# Patient Record
Sex: Male | Born: 1955 | Race: White | Hispanic: No | Marital: Married | Smoking: Current every day smoker
Health system: Southern US, Community
[De-identification: ages and names within clinical notes are randomized; demographics above are authoritative.]

## PROBLEM LIST (undated history)

## (undated) DIAGNOSIS — A770 Spotted fever due to Rickettsia rickettsii: Secondary | ICD-10-CM

## (undated) DIAGNOSIS — Z720 Tobacco use: Secondary | ICD-10-CM

## (undated) DIAGNOSIS — R079 Chest pain, unspecified: Secondary | ICD-10-CM

## (undated) DIAGNOSIS — K5792 Diverticulitis of intestine, part unspecified, without perforation or abscess without bleeding: Secondary | ICD-10-CM

## (undated) HISTORY — PX: FINGER SURGERY: SHX640

## (undated) HISTORY — PX: OTHER SURGICAL HISTORY: SHX169

---

## 2010-01-20 ENCOUNTER — Ambulatory Visit: Payer: Self-pay | Admitting: Cardiology

## 2015-01-20 ENCOUNTER — Observation Stay (HOSPITAL_COMMUNITY)
Admission: AD | Admit: 2015-01-20 | Discharge: 2015-01-21 | Disposition: A | Discharge status: Home / Self Care | Payer: BC Managed Care – PPO | Source: Other Acute Inpatient Hospital | Attending: Cardiology | Admitting: Cardiology

## 2015-01-20 ENCOUNTER — Encounter (HOSPITAL_COMMUNITY): Payer: Self-pay | Admitting: Nurse Practitioner

## 2015-01-20 DIAGNOSIS — Z720 Tobacco use: Secondary | ICD-10-CM | POA: Diagnosis not present

## 2015-01-20 DIAGNOSIS — Z8619 Personal history of other infectious and parasitic diseases: Secondary | ICD-10-CM | POA: Insufficient documentation

## 2015-01-20 DIAGNOSIS — R072 Precordial pain: Secondary | ICD-10-CM

## 2015-01-20 DIAGNOSIS — R0781 Pleurodynia: Secondary | ICD-10-CM | POA: Diagnosis present

## 2015-01-20 DIAGNOSIS — R0789 Other chest pain: Secondary | ICD-10-CM | POA: Diagnosis present

## 2015-01-20 DIAGNOSIS — R079 Chest pain, unspecified: Secondary | ICD-10-CM

## 2015-01-20 HISTORY — DX: Chest pain, unspecified: R07.9

## 2015-01-20 HISTORY — DX: Diverticulitis of intestine, part unspecified, without perforation or abscess without bleeding: K57.92

## 2015-01-20 HISTORY — DX: Tobacco use: Z72.0

## 2015-01-20 HISTORY — DX: Spotted fever due to Rickettsia rickettsii: A77.0

## 2015-01-20 LAB — MRSA PCR SCREENING: MRSA by PCR: NEGATIVE

## 2015-01-20 LAB — HEPARIN LEVEL (UNFRACTIONATED): Heparin Unfractionated: 0.29 IU/mL — ABNORMAL LOW (ref 0.30–0.70)

## 2015-01-20 LAB — D-DIMER, QUANTITATIVE: D-Dimer, Quant: 0.41 ug/mL-FEU (ref 0.00–0.48)

## 2015-01-20 LAB — TROPONIN I

## 2015-01-20 MED ORDER — HYDROCODONE-ACETAMINOPHEN 5-325 MG PO TABS
1.0000 | ORAL_TABLET | Freq: Four times a day (QID) | ORAL | Status: DC | PRN
  Administered 2015-01-20: 2 via ORAL
  Filled 2015-01-20: qty 2

## 2015-01-20 MED ORDER — ONDANSETRON HCL 4 MG/2ML IJ SOLN: 4.0000 mg | Freq: Four times a day (QID) | INTRAMUSCULAR | Status: DC | PRN

## 2015-01-20 MED ORDER — ACETAMINOPHEN 325 MG PO TABS: 650.0000 mg | ORAL_TABLET | ORAL | Status: DC | PRN

## 2015-01-20 MED ORDER — NITROGLYCERIN 0.4 MG SL SUBL: 0.4000 mg | SUBLINGUAL_TABLET | SUBLINGUAL | Status: DC | PRN

## 2015-01-20 MED ORDER — OXYCODONE HCL 5 MG PO TABS
10.0000 mg | ORAL_TABLET | Freq: Once | ORAL | Status: AC
  Administered 2015-01-21: 10 mg via ORAL
  Filled 2015-01-20 (×2): qty 2

## 2015-01-20 MED ORDER — SODIUM CHLORIDE 0.9 % IV SOLN: 250.0000 mL | INTRAVENOUS | Status: DC | PRN

## 2015-01-20 MED ORDER — COLCHICINE 0.6 MG PO TABS
0.6000 mg | ORAL_TABLET | Freq: Two times a day (BID) | ORAL | Status: DC
  Administered 2015-01-20 – 2015-01-21 (×2): 0.6 mg via ORAL
  Filled 2015-01-20 (×2): qty 1

## 2015-01-20 MED ORDER — IBUPROFEN 400 MG PO TABS
400.0000 mg | ORAL_TABLET | Freq: Three times a day (TID) | ORAL | Status: DC
  Administered 2015-01-20 – 2015-01-21 (×2): 400 mg via ORAL
  Filled 2015-01-20 (×2): qty 2
  Filled 2015-01-20 (×4): qty 1

## 2015-01-20 MED ORDER — SODIUM CHLORIDE 0.9 % IJ SOLN: 3.0000 mL | INTRAMUSCULAR | Status: DC | PRN

## 2015-01-20 MED ORDER — HEPARIN (PORCINE) IN NACL 100-0.45 UNIT/ML-% IJ SOLN
1450.0000 [IU]/h | INTRAMUSCULAR | Status: DC
  Administered 2015-01-21: 1450 [IU]/h via INTRAVENOUS
  Filled 2015-01-20: qty 250

## 2015-01-20 MED ORDER — SODIUM CHLORIDE 0.9 % IJ SOLN
3.0000 mL | Freq: Two times a day (BID) | INTRAMUSCULAR | Status: DC
Start: 2015-01-20 — End: 2015-01-21
  Administered 2015-01-20 – 2015-01-21 (×2): 3 mL via INTRAVENOUS

## 2015-01-20 MED ORDER — ASPIRIN EC 81 MG PO TBEC
81.0000 mg | DELAYED_RELEASE_TABLET | Freq: Every day | ORAL | Status: DC
  Administered 2015-01-21: 81 mg via ORAL
  Filled 2015-01-20: qty 1

## 2015-01-20 MED ORDER — PANTOPRAZOLE SODIUM 40 MG PO TBEC
40.0000 mg | DELAYED_RELEASE_TABLET | Freq: Every day | ORAL | Status: DC
  Administered 2015-01-21: 40 mg via ORAL
  Filled 2015-01-20 (×2): qty 1

## 2015-01-20 NOTE — Progress Notes (Signed)
Patient arrived @ [phone]; no acute changes ; chest pain when breath in; vitals WNL; heparin infusion @ 56ml/hr; Nitro drip infusion @ 109mcg/min; Nurse practitioner  In room with patient @ 1850; waiting for admission orders; will give report to on coming RN

## 2015-01-20 NOTE — H&P (Signed)
Patient ID: Steven Craig MRN: [id number], DOB/AGE: 1955/11/14   Admit date: 01/20/2015   Primary Physician: Curlene Labrum, MD Primary Cardiologist: new - would f/u in [place].  Pt. Profile:  59 year old male without prior cardiac history who presents on transfer from [facility] secondary to pleuritic chest pain.  Problem List  Past Medical History  Diagnosis Date  . [facility] spotted fever     a. approx 2011;  b. possible recurrence 11/2014.  . Tobacco abuse     a. 40 pack years, currently smoking 1 ppd.    Past Surgical History  Procedure Laterality Date  . Right ankle fracture and surgical correction       Allergies  Allergies not on file  HPI  59 year old male with a prior history of tobacco abuse. He does not have any prior history of coronary artery disease. He does have some CAD in his father and maternal grandfather but both were elderly when they felt disease. 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 He is relatively active at home but does not routinely exercise. He continues to smoke one pack of cigarettes daily and drinks 3-4 beers daily.  He was in his usual state of health until this afternoon when while driving, he had sudden onset of sharp retrosternal chest discomfort that was worse with deep breathing. He went home and took an aspirin and laid down but symptoms seemed to worsen. He eventually presented to [facility] where ECG was nonacute and troponin was normal. Labs were otherwise unrevealing and chest x-ray was normal. He was treated with heparin, sublingual nitroglycerin, Nitrostat paste, and subsequently IV nitroglycerin and has continued to have intermittent pleuritic chest discomfort that is specifically worsened by lying active and deep breathing. There is no reproduction of symptoms with palpation or position changes. Of note,  on further questioning, he reports that in June, he suffered several tick bites and developed myalgias. He was treated with a 2 week course of doxycycline at that time. His had no recent viral type illnesses or symptoms. He denies any history of PND, orthopnea, dizziness, syncope, edema, or early satiety.  Home Medications  Prior to Admission medications   None     Family History  Family History  Problem Relation Age of Onset  . CAD Father     alive in his 2's  . Seizures      alive in her 65's  . Other Sister     alive and well.    Social History  History   Social History  . Marital Status: Married    Spouse Name: N/A  . Number of Children: N/A  . Years of Education: N/A   Occupational History  . Not on file.   Social History Main Topics  . Smoking status: Current Every Day Smoker -- 1.00 packs/day for 40 years    Types: Cigarettes  . Smokeless tobacco: Not on file  . Alcohol Use: 0.0 oz/week    0 Standard drinks or equivalent per week     Comment: 3-4 beers nightly  . Drug Use: No  . Sexual Activity: Not on file   Other Topics Concern  . Not on file   Social History Narrative   Lives in [place] with wife and 71 y/o daughter.  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  Active but does  not routinely exercise.     Review of Systems General:  No chills, fever, night sweats or weight changes.  Cardiovascular:  Positive pleuritic chest pain as outlined above. No dyspnea on exertion, edema, orthopnea, palpitations, paroxysmal nocturnal dyspnea. Dermatological: No rash, lesions/masses Respiratory: No cough, dyspnea Urologic: No hematuria, dysuria Abdominal:   No nausea, vomiting, diarrhea, bright red blood per rectum, melena, or hematemesis Neurologic:  No visual changes, wkns, changes in mental status. All other systems reviewed and are otherwise negative except as noted above.  Physical Exam  Blood pressure 125/85, pulse 73, temperature  98.8 F (37.1 C), temperature source Oral, resp. rate 13, height 6' 1"  (1.854 m), weight 203 lb 12.8 oz (92.443 kg), SpO2 98 %.  General: Pleasant, NAD Psych: Normal affect. Neuro: Alert and oriented X 3. Moves all extremities spontaneously. HEENT: Normal  Neck: Supple without bruits or JVD. Lungs:  Resp regular and unlabored, CTA. Heart: RRR no s3, s4, or murmurs. No rub. Abdomen: Soft, non-tender, non-distended, BS + x 4.  Extremities: No clubbing, cyanosis or edema. DP/PT/Radials 2+ and equal bilaterally.  Labs  Hemoglobin 15.8, hematocrit 45.7, W BC 8.3, platelets 210  ESR 2  Sodium 137, potassium 3.9, chloride 101, CO2 21, BUN 15, creatinine 1.0, magnesium 2.1, glucose 106  Troponin T less than 0.01  INR 0.9, PTT 30.4, PT 9.9   Radiology/Studies  Chest x-ray at [facility] showed no active cardio pulmonary disease.  ECG  Regular sinus rhythm, 85, no acute ST or T changes.  ASSESSMENT AND PLAN  1. Midsternal chest pain: Patient presented with midsternal and pleuritic chest discomfort that is worse with deep breathing and lying back, beginning this afternoon. He continues to have a low level, dull ache while at rest, that worsens becomes more sharp with lying back or deep breathing. Initial troponin is normal and ECG is nonacute. His symptoms are most consistent with those of pericarditis, though he does not have any evidence of ST segment elevation on his ECG and does not have a rub on exam. He has not had any recent viral illnesses but was treated for presumed [facility] spotted fever/tick borne illness in June of this year. We will admit and cycle cardiac markers. Check d-dimer. Discontinue IV nitroglycerin. Continue heparin for now but discontinue if subsequent troponins are normal. If he rules out for myocardial infarction, we will plan to obtain 2-D echocardiogram in the a.m. to evaluate LV function and rule out pericardial effusion. We will add ibuprofen and  colchicine therapy for presumptive pericarditis. Given risk factors of tobacco abuse and age, we could consider outpatient exercise stress testing. If cardiac markers returned abnormal, we would likely plan to pursue diagnostic catheterization.  2. Tobacco abuse: Complete cessation advised.  3. EtOH abuse: Patient takes 3-4 beers daily. Cessation advised.  Signed, Murray Hodgkins, NP 01/20/2015, 7:10 PM History reviewed with patient. Sudden onset of very sharp chest pain with inspiration, worse with lying down, better with sitting up or standing. Exam tonight reveals patient in no distress lying at 30 degree gatch. Chest reveals scattered rhonchi and he is in the process of trying to quit smoking. Heart reveals no murmur, gallop or rub. Abdomen nontender. Extremities reveal good pulses, no edema.  Symptoms are suggestive of pericarditis.  Agree with plan as noted above. Consider outpatient stress testing if he rules out for myocardial damage. Will get followup EKG in am.

## 2015-01-20 NOTE — Progress Notes (Addendum)
ANTICOAGULATION CONSULT NOTE - Initial Consult  Pharmacy Consult for heparin Indication: chest pain/ACS  Allergies no known allergies  Patient Measurements: Height:  (185.4 cm) Weight: 203 lb 12.8 oz (92.443 kg) IBW/kg (Calculated) : 79.9 Heparin Dosing Weight: 92kg  Vital Signs: Temp: 98.8 F (37.1 C) (07/27 1832) Temp Source: Oral (07/27 1832) BP: 125/85 mmHg (07/27 1840) Pulse Rate: 73 (07/27 1840)  Labs: No results for input(s): HGB, HCT, PLT, APTT, LABPROT, INR, HEPARINUNFRC, CREATININE, CKTOTAL, CKMB, TROPONINI in the last 72 hours.  CrCl cannot be calculated (Patient has no serum creatinine result on file.).   Medical History: Past Medical History  Diagnosis Date  . [facility] spotted fever     a. approx 2011;  b. possible recurrence 11/2014.  . Tobacco abuse     a. 40 pack years, currently smoking 1 ppd.    Medications:  On no scheduled meds PTA  Assessment: 59 year old man admitted to [facility] for ACS.  Heparin infusion infusing from [facility] hospital at 1360 units/hr.  This was started aroun 1530 per records.  Will Check a heparin level at 2130 and adjust per goal. Goal of Therapy:  Heparin level 0.3-0.7 units/ml Monitor platelets by anticoagulation protocol: Yes   Plan:  Continue heparin at 1360 units/hr Heparin level at 2130 - 6 hours after heparin started at [facility] Daily heparin level and CBC  Mickeal Skinner 01/20/2015,8:05 PM  ADDENDUM Initial heparin level slightly low at 0.29 units/mL. No bleeding noted.  Plan: -increase heparin gtt to 1450 units/hr -daily HL and CBC  Fathima Bartl D. Clennon Nasca, PharmD, BCPS Clinical Pharmacist Pager: [phone] 01/20/2015 10:43 PM

## 2015-01-21 ENCOUNTER — Encounter (HOSPITAL_COMMUNITY): Payer: Self-pay | Admitting: General Practice

## 2015-01-21 ENCOUNTER — Inpatient Hospital Stay (HOSPITAL_BASED_OUTPATIENT_CLINIC_OR_DEPARTMENT_OTHER): Payer: BC Managed Care – PPO

## 2015-01-21 ENCOUNTER — Other Ambulatory Visit: Payer: Self-pay | Admitting: Physician Assistant

## 2015-01-21 DIAGNOSIS — Z8619 Personal history of other infectious and parasitic diseases: Secondary | ICD-10-CM | POA: Diagnosis not present

## 2015-01-21 DIAGNOSIS — R0781 Pleurodynia: Secondary | ICD-10-CM | POA: Diagnosis not present

## 2015-01-21 DIAGNOSIS — R079 Chest pain, unspecified: Secondary | ICD-10-CM | POA: Diagnosis not present

## 2015-01-21 DIAGNOSIS — Z720 Tobacco use: Secondary | ICD-10-CM | POA: Diagnosis not present

## 2015-01-21 DIAGNOSIS — R072 Precordial pain: Secondary | ICD-10-CM | POA: Diagnosis not present

## 2015-01-21 DIAGNOSIS — R0789 Other chest pain: Secondary | ICD-10-CM

## 2015-01-21 LAB — LIPID PANEL
Cholesterol: 222 mg/dL — ABNORMAL HIGH (ref 0–200)
HDL: 37 mg/dL — ABNORMAL LOW (ref >40)
LDL CALC: 153 mg/dL — AB (ref 0–99)
Total CHOL/HDL Ratio: 6 RATIO
Triglycerides: 162 mg/dL — ABNORMAL HIGH (ref <150)
VLDL: 32 mg/dL (ref 0–40)

## 2015-01-21 LAB — TSH: TSH: 2.935 u[IU]/mL (ref 0.350–4.500)

## 2015-01-21 LAB — CBC
HEMATOCRIT: 42.9 % (ref 39.0–52.0)
Hemoglobin: 14.6 g/dL (ref 13.0–17.0)
MCH: 31.8 pg (ref 26.0–34.0)
MCHC: 34 g/dL (ref 30.0–36.0)
MCV: 93.5 fL (ref 78.0–100.0)
Platelets: 178 10*3/uL (ref 150–400)
RBC: 4.59 MIL/uL (ref 4.22–5.81)
RDW: 14.2 % (ref 11.5–15.5)
WBC: 10.3 10*3/uL (ref 4.0–10.5)

## 2015-01-21 LAB — BASIC METABOLIC PANEL
ANION GAP: 4 — AB (ref 5–15)
BUN: 12 mg/dL (ref 6–20)
CO2: 27 mmol/L (ref 22–32)
Calcium: 8.5 mg/dL — ABNORMAL LOW (ref 8.9–10.3)
Chloride: 107 mmol/L (ref 101–111)
Creatinine, Ser: 0.97 mg/dL (ref 0.61–1.24)
GFR calc non Af Amer: >60 mL/min (ref >60)
Glucose, Bld: 99 mg/dL (ref 65–99)
Potassium: 4 mmol/L (ref 3.5–5.1)
Sodium: 138 mmol/L (ref 135–145)

## 2015-01-21 LAB — TROPONIN I
Troponin I: <0.03 ng/mL (ref <0.031)
Troponin I: <0.03 ng/mL (ref <0.031)

## 2015-01-21 LAB — HEPARIN LEVEL (UNFRACTIONATED)
Heparin Unfractionated: 0.35 IU/mL (ref 0.30–0.70)
Heparin Unfractionated: 0.36 IU/mL (ref 0.30–0.70)

## 2015-01-21 LAB — SEDIMENTATION RATE: Sed Rate: 7 mm/hr (ref 0–16)

## 2015-01-21 MED ORDER — ASPIRIN 81 MG PO TBEC: 81.0000 mg | DELAYED_RELEASE_TABLET | Freq: Every day | ORAL | Status: AC

## 2015-01-21 MED ORDER — IBUPROFEN 200 MG PO TABS: 400.0000 mg | ORAL_TABLET | Freq: Three times a day (TID) | ORAL | Status: AC

## 2015-01-21 MED FILL — Nitroglycerin IV Soln 200 MCG/ML in D5W: INTRAVENOUS | Qty: 250 | Status: AC

## 2015-01-21 MED FILL — Heparin Sodium (Porcine) 100 Unt/ML in Sodium Chloride 0.45%: INTRAMUSCULAR | Qty: 250 | Status: AC

## 2015-01-21 NOTE — Progress Notes (Signed)
UR Completed Clay Solum Graves-Bigelow, RN,BSN [phone]  

## 2015-01-21 NOTE — Progress Notes (Signed)
    Subjective:  Feels better today. Still with mild sharp discomfort on inspiration. No shortness of breath.  Objective:  Vital Signs in the last 24 hours: Temp:  [97.8 F (36.6 C)-98.8 F (37.1 C)] 97.8 F (36.6 C) (07/28 0520) Pulse Rate:  [59-86] 59 (07/28 0520) Resp:  [13-18] 16 (07/28 0242) BP: (108-125)/(67-87) 113/80 mmHg (07/28 0520) SpO2:  [94 %-98 %] 96 % (07/28 0520) Weight:  [203 lb 12.8 oz (92.443 kg)] 203 lb 12.8 oz (92.443 kg) (07/27 1832)  Intake/Output from previous day: 07/27 0701 - 07/28 0700 In: 240 [P.O.:240] Out: 725 [Urine:725]  Physical Exam: Pt is alert and oriented, NAD HEENT: normal Neck: JVP - normal Lungs: CTA bilaterally CV: RRR without murmur or gallop. No friction rub Abd: soft, NT Ext: no C/C/E, distal pulses intact and equal Skin: warm/dry no rash   Lab Results:  Recent Labs  01/21/15 0401  WBC 10.3  HGB 14.6  PLT 178    Recent Labs  01/21/15 0719  NA 138  K 4.0  CL 107  CO2 27  GLUCOSE 99  BUN 12  CREATININE 0.97    Recent Labs  01/21/15 0108 01/21/15 0719  TROPONINI <0.03 <0.03    Cardiac Studies: 2-D echocardiogram pending  Tele: Normal sinus rhythm  Assessment/Plan:  1. Pleuritic chest pain: No EKG evidence of pericarditis. He does feel better with non-steroidal anti-inflammatory. Differential diagnosis includes acute carditis without EKG changes versus costochondritis. 2-D echo pending today. I think he can be discharged home after his echocardiogram. Would discharge on 1-2 weeks of non-small and tight inflammatory medication as needed. Recommend outpatient exercise treadmill study to evaluate for ischemic heart disease.  2. Tobacco abuse: Cessation counseling done.  Disposition: Home today as long as echocardiogram shows no major abnormalities.   Tonny Bollman, M.D. 01/21/2015, 12:58 PM

## 2015-01-21 NOTE — Progress Notes (Signed)
ANTICOAGULATION CONSULT NOTE - Initial Consult  Pharmacy Consult for heparin Indication: chest pain/ACS  No Known Allergies  Patient Measurements: Height:  (185.4 cm) Weight: 203 lb 12.8 oz (92.443 kg) IBW/kg (Calculated) : 79.9 Heparin Dosing Weight: 92kg  Vital Signs: Temp: 97.8 F (36.6 C) (07/28 0520) Temp Source: Oral (07/28 0520) BP: 113/80 mmHg (07/28 0520) Pulse Rate: 59 (07/28 0520)  Labs:  Recent Labs  01/20/15 2154 01/21/15 0108 01/21/15 0401 01/21/15 0719  HGB  --   --  14.6  --   HCT  --   --  42.9  --   PLT  --   --  178  --   HEPARINUNFRC 0.29*  --  0.35  --   CREATININE  --   --   --  0.97  TROPONINI <0.03 <0.03  --  <0.03    Estimated Creatinine Clearance: 96.1 mL/min (by C-G formula based on Cr of 0.97).   Medical History: Past Medical History  Diagnosis Date  . [facility] spotted fever     a. approx 2011;  b. possible recurrence 11/2014.  . Tobacco abuse     a. 40 pack years, currently smoking 1 ppd.  . Chest pain 01/20/2015  . Diverticulitis     Medications:  On no scheduled meds PTA  Assessment: 59 year old man admitted with chest pain, initiated heparin gtt last night for possible ACS. Troponins negative. CBC stable and wnl, SCr 0.9, no s/sx bleeding.   Goal of Therapy:  Heparin level 0.3-0.7 units/ml Monitor platelets by anticoagulation protocol: Yes   Plan:  Continue heparin at 1450 units/hr Daily HL, CBC Monitor s/sx bleeding  F/u cardiology plans    Agapito Games, PharmD, BCPS Clinical Pharmacist Pager: [phone] 01/21/2015 8:56 AM

## 2015-01-21 NOTE — Progress Notes (Addendum)
@  7829 Assumed care from off going RN; patient resting in bed with no complaints at this time; heparin drip infusion @ 14.100ml/hr; patient NPO; will continue to monitor patient;    patient has c/o's pain 3 on pain scale; medication offer but refused   heparin drip stopped

## 2015-01-21 NOTE — Discharge Instructions (Addendum)
Recommend taking ibuprofen 400mg  three times daily for a week.  Cardiac Diet This diet can help prevent heart disease and stroke. Many factors influence your heart health, including eating and exercise habits. Coronary risk rises a lot with abnormal blood fat (lipid) levels. Cardiac meal planning includes limiting unhealthy fats, increasing healthy fats, and making other small dietary changes. General guidelines are as follows:  Adjust calorie intake to reach and maintain desirable body weight.  Limit total fat intake to less than 30% of total calories. Saturated fat should be less than 7% of calories.  Saturated fats are found in animal products and in some vegetable products. Saturated vegetable fats are found in coconut oil, cocoa butter, palm oil, and palm kernel oil. Read labels carefully to avoid these products as much as possible. Use butter in moderation. Choose tub margarines and oils that have 2 grams of fat or less. Good cooking oils are canola and olive oils.  Practice low-fat cooking techniques. Do not fry food. Instead, broil, bake, boil, steam, grill, roast on a rack, stir-fry, or microwave it. Other fat reducing suggestions include:  Remove the skin from poultry.  Remove all visible fat from meats.  Skim the fat off stews, soups, and gravies before serving them.  Steam vegetables in water or broth instead of sauting them in fat.  Avoid foods with trans fat (or hydrogenated oils), such as commercially fried foods and commercially baked goods. Commercial shortening and deep-frying fats will contain trans fat.  Increase intake of fruits, vegetables, whole grains, and legumes to replace foods high in fat.  Increase consumption of nuts, legumes, and seeds to at least 4 servings weekly. One serving of a legume equals  cup, and 1 serving of nuts or seeds equals  cup.  Choose whole grains more often. Have 3 servings per day (a serving is 1 ounce [oz]).  Eat 4 to 5 servings of  vegetables per day. A serving of vegetables is 1 cup of raw leafy vegetables;  cup of raw or cooked cut-up vegetables;  cup of vegetable juice.  Eat 4 to 5 servings of fruit per day. A serving of fruit is 1 medium whole fruit;  cup of dried fruit;  cup of fresh, frozen, or canned fruit;  cup of 100% fruit juice.  Increase your intake of dietary fiber to 20 to 30 grams per day. Insoluble fiber may help lower your risk of heart disease and may help curb your appetite.  Soluble fiber binds cholesterol to be removed from the blood. Foods high in soluble fiber are dried beans, citrus fruits, oats, apples, bananas, broccoli, Brussels sprouts, and eggplant.  Try to include foods fortified with plant sterols or stanols, such as yogurt, breads, juices, or margarines. Choose several fortified foods to achieve a daily intake of 2 to 3 grams of plant sterols or stanols.  Foods with omega-3 fats can help reduce your risk of heart disease. Aim to have a 3.5 oz portion of fatty fish twice per week, such as salmon, mackerel, albacore tuna, sardines, lake trout, or herring. If you wish to take a fish oil supplement, choose one that contains 1 gram of both DHA and EPA.  Limit processed meats to 2 servings (3 oz portion) weekly.  Limit the sodium in your diet to 1500 milligrams (mg) per day. If you have high blood pressure, talk to a registered dietitian about a DASH (Dietary Approaches to Stop Hypertension) eating plan.  Limit sweets and beverages with added sugar, such  as soda, to no more than 5 servings per week. One serving is:   1 tablespoon sugar.  1 tablespoon jelly or jam.   cup sorbet.  1 cup lemonade.   cup regular soda. CHOOSING FOODS Starches  Allowed: Breads: All kinds (wheat, rye, raisin, white, oatmeal, [place], [place], and English muffin bread). Low-fat rolls: English muffins, frankfurter and hamburger buns, bagels, pita bread, tortillas (not fried). Pancakes, waffles, biscuits, and  muffins made with recommended oil.  Avoid: Products made with saturated or trans fats, oils, or whole milk products. Butter rolls, cheese breads, croissants. Commercial doughnuts, muffins, sweet rolls, biscuits, waffles, pancakes, store-bought mixes. Crackers  Allowed: Low-fat crackers and snacks: Animal, graham, rye, saltine (with recommended oil, no lard), oyster, and matzo crackers. Bread sticks, melba toast, rusks, flatbread, pretzels, and light popcorn.  Avoid: High-fat crackers: cheese crackers, butter crackers, and those made with coconut, palm oil, or trans fat (hydrogenated oils). Buttered popcorn. Cereals  Allowed: Hot or cold whole-grain cereals.  Avoid: Cereals containing coconut, hydrogenated vegetable fat, or animal fat. Potatoes / Pasta / Rice  Allowed: All kinds of potatoes, rice, and pasta (such as macaroni, spaghetti, and noodles).  Avoid: Pasta or rice prepared with cream sauce or high-fat cheese. Chow mein noodles, [place] fries. Vegetables  Allowed: All vegetables and vegetable juices.  Avoid: Fried vegetables. Vegetables in cream, butter, or high-fat cheese sauces. Limit coconut. Fruit in cream or custard. Protein  Allowed: Limit your intake of meat, seafood, and poultry to no more than 6 oz (cooked weight) per day. All lean, well-trimmed beef, veal, pork, and lamb. All chicken and [place] without skin. All fish and shellfish. Wild game: wild duck, rabbit, pheasant, and venison. Egg whites or low-cholesterol egg substitutes may be used as desired. Meatless dishes: recipes with dried beans, peas, lentils, and tofu (soybean curd). Seeds and nuts: all seeds and most nuts.  Avoid: Prime grade and other heavily marbled and fatty meats, such as short ribs, spare ribs, rib eye roast or steak, frankfurters, sausage, bacon, and high-fat luncheon meats, mutton. Caviar. Commercially fried fish. Domestic duck, goose, venison sausage. Organ meats: liver, gizzard, heart,  chitterlings, brains, kidney, sweetbreads. Dairy  Allowed: Low-fat cheeses: nonfat or low-fat cottage cheese (1% or 2% fat), cheeses made with part skim milk, such as mozzarella, farmers, string, or ricotta. (Cheeses should be labeled no more than 2 to 6 grams fat per oz.). Skim (or 1%) milk: liquid, powdered, or evaporated. Buttermilk made with low-fat milk. Drinks made with skim or low-fat milk or cocoa. Chocolate milk or cocoa made with skim or low-fat (1%) milk. Nonfat or low-fat yogurt.  Avoid: Whole milk cheeses, including colby, cheddar, [place], [address], [place], [place], [place], [address], [place], and blue. Creamed cottage cheese, cream cheese. Whole milk and whole milk products, including buttermilk or yogurt made from whole milk, drinks made from whole milk. Condensed milk, evaporated whole milk, and 2% milk. Soups and Combination Foods  Allowed: Low-fat low-sodium soups: broth, dehydrated soups, homemade broth, soups with the fat removed, homemade cream soups made with skim or low-fat milk. Low-fat spaghetti, lasagna, chili, and Spanish rice if low-fat ingredients and low-fat cooking techniques are used.  Avoid: Cream soups made with whole milk, cream, or high-fat cheese. All other soups. Desserts and Sweets  Allowed: Sherbet, fruit ices, gelatins, meringues, and angel food cake. Homemade desserts with recommended fats, oils, and milk products. Jam, jelly, honey, marmalade, sugars, and syrups. Pure sugar candy, such as gum drops, hard candy, jelly beans, marshmallows,  mints, and small amounts of dark chocolate.  Avoid: Commercially prepared cakes, pies, cookies, frosting, pudding, or mixes for these products. Desserts containing whole milk products, chocolate, coconut, lard, palm oil, or palm kernel oil. Ice cream or ice cream drinks. Candy that contains chocolate, coconut, butter, hydrogenated fat, or unknown ingredients. Buttered syrups. Fats and Oils  Allowed: Vegetable oils:  safflower, sunflower, corn, soybean, cottonseed, sesame, canola, olive, or peanut. Non-hydrogenated margarines. Salad dressing or mayonnaise: homemade or commercial, made with a recommended oil. Low or nonfat salad dressing or mayonnaise.  Limit added fats and oils to 6 to 8 tsp per day (includes fats used in cooking, baking, salads, and spreads on bread). Remember to count the "hidden fats" in foods.  Avoid: Solid fats and shortenings: butter, lard, salt pork, bacon drippings. Gravy containing meat fat, shortening, or suet. Cocoa butter, coconut. Coconut oil, palm oil, palm kernel oil, or hydrogenated oils: these ingredients are often used in bakery products, nondairy creamers, whipped toppings, candy, and commercially fried foods. Read labels carefully. Salad dressings made of unknown oils, sour cream, or cheese, such as blue cheese and Roquefort. Cream, all kinds: half-and-half, light, heavy, or whipping. Sour cream or cream cheese (even if "light" or low-fat). Nondairy cream substitutes: coffee creamers and sour cream substitutes made with palm, palm kernel, hydrogenated oils, or coconut oil. Beverages  Allowed: Coffee (regular or decaffeinated), tea. Diet carbonated beverages, mineral water. Alcohol: Check with your caregiver. Moderation is recommended.  Avoid: Whole milk, regular sodas, and juice drinks with added sugar. Condiments  Allowed: All seasonings and condiments. Cocoa powder. "Cream" sauces made with recommended ingredients.  Avoid: Carob powder made with hydrogenated fats. SAMPLE MENU Breakfast   cup orange juice   cup oatmeal  1 slice toast  1 tsp margarine  1 cup skim milk Lunch  [place] sandwich with 2 oz [place], 2 slices bread  Lettuce and tomato slices  Fresh fruit  Carrot sticks  Coffee or tea Snack  Fresh fruit or low-fat crackers Dinner  3 oz lean ground beef  1 baked potato  1 tsp margarine   cup asparagus  Lettuce salad  1 tbs  non-creamy dressing   cup peach slices  1 cup skim milk Document Released: 03/21/2008 Document Revised: 12/12/2011 Document Reviewed: 08/12/2013 Exit[employer] Patient Information 2015 [place], [place]. This information is not intended to replace advice given to you by your health care provider. Make sure you discuss any questions you have with your health care provider.

## 2015-01-22 NOTE — Discharge Summary (Signed)
Physician Discharge Summary     Cardiologist: New. He will follow-up in [facility] Patient ID: SNEIJDER BERNARDS MRN: [id number] DOB/AGE: 1956/01/16 59 y.o.  Admit date: 01/20/2015 Discharge date: 01/22/2015  Admission Diagnoses: Midsternal chest pain  Discharge Diagnoses:  Active Problems:   Midsternal chest pain   tobacco abuse   EtOH abuse  Discharged Condition: stable  Hospital Course:  59 year old male with a prior history of tobacco abuse. He does not have any prior history of coronary artery disease. He does have some CAD in his father and maternal grandfather but both were elderly when they felt disease. 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 He was previously a middle school Retail buyer. He is relatively active at home but does not routinely exercise. He continues to smoke one pack of cigarettes daily and drinks 3-4 beers daily.  He was in his usual state of health until this afternoon when while driving, he had sudden onset of sharp retrosternal chest discomfort that was worse with deep breathing. He went home and took an aspirin and laid down but symptoms seemed to worsen. He eventually presented to [facility] where ECG was nonacute and troponin was normal. Labs were otherwise unrevealing and chest x-ray was normal. He was treated with heparin, sublingual nitroglycerin, Nitrostat paste, and subsequently IV nitroglycerin and has continued to have intermittent pleuritic chest discomfort that is specifically worsened by lying active and deep breathing. There is no reproduction of symptoms with palpation or position changes. Of note, on further questioning, he reports that in June, he suffered several tick bites and developed myalgias. He was treated with a 2 week course of doxycycline at that time. His had no recent viral type illnesses or symptoms. He denies any history of PND, orthopnea, dizziness, syncope, edema, or early  satiety.  Patient was admitted and enzymes were negative. We discontinued his IV nitroglycerin did continue heparin initially. He was also started on ibuprofen and colchicine for presumptive pericarditis.  He had a 2-D echocardiogram which revealed an ejection fraction of 55-60%. LV was normal in size. No pericardial effusion. Grade 1 diastolic dysfunction.  We discontinued colchicine prior to discharge but did continue ibuprofen for 1 week.  He will have an outpatient stress test.  The patient was seen by Dr. Excell Seltzer who felt he was stable for DC home.   Consults: None  Significant Diagnostic Studies:  Echocardiogram Study Conclusions  - Left ventricle: The cavity size was normal. Wall thickness was normal. Systolic function was normal. The estimated ejection fraction was in the range of 55% to 60%. Wall motion was normal; there were no regional wall motion abnormalities. Doppler parameters are consistent with abnormal left ventricular relaxation (grade 1 diastolic dysfunction). - Aortic valve: There was no stenosis. - Mitral valve: There was no significant regurgitation. - Left atrium: The atrium was mildly dilated. - Right ventricle: The cavity size was normal. Systolic function was normal. - Right atrium: The atrium was mildly dilated. - Pulmonary arteries: No complete TR doppler jet so unable to estimate PA systolic pressure. - Inferior vena cava: The vessel was normal in size. The respirophasic diameter changes were in the normal range (>= 50%), consistent with normal central venous pressure.  Impressions:  - Normal LV size and systolic function, EF 55-60%. Normal RV size and systolic function. No significant valvular abnormalities.  Treatments: See above  Discharge Exam: Blood pressure 151/82, pulse 86, temperature 98.7 F (  37.1 C), temperature source Oral, resp. rate 16, height  (1.854 m), weight 203 lb 12.8 oz (92.443 kg), SpO2 96  %.   Disposition: 01-Home or Self Care  Discharge Instructions    Diet - low sodium heart healthy    Complete by:  As directed      Increase activity slowly    Complete by:  As directed             Medication List    TAKE these medications        acetaminophen 500 MG tablet  Commonly known as:  TYLENOL  Take 500-1,000 mg by mouth every 6 (six) hours as needed for mild pain or headache.     aspirin 81 MG EC tablet  Take 1 tablet (81 mg total) by mouth daily.     ibuprofen 200 MG tablet  Commonly known as:  ADVIL,MOTRIN  Take 2 tablets (400 mg total) by mouth 3 (three) times daily.  Notes to Patient:  Take one dose tonight     tetrahydrozoline-zinc 0.05-0.25 % ophthalmic solution  Commonly known as:  VISINE-AC  Place 2 drops into both eyes 3 (three) times daily as needed (allergies).           Follow-up Information    Follow up with Juliette Alcide, MD In 1 week.   Why:  If symptoms worsen   Contact information:   [address] [place] [address] [phone]      Greater than 30 minutes was spent completing the patient's discharge.   SignedWilburt Finlay, PA-C 01/22/2015, 3:03 PM

## 2015-01-26 ENCOUNTER — Telehealth (HOSPITAL_COMMUNITY): Payer: Self-pay | Admitting: *Deleted

## 2015-01-26 ENCOUNTER — Telehealth (HOSPITAL_COMMUNITY): Payer: Self-pay

## 2015-01-26 NOTE — Telephone Encounter (Signed)
Left message on voicemail in reference to upcoming appointment scheduled for 01/26/15. Phone number given for a call back so details instructions can be given. Viraaj Vorndran, Adelene Idler

## 2015-01-26 NOTE — Telephone Encounter (Signed)
Patient given detailed instructions per Myocardial Perfusion Study Information Sheet for test on 01-28-2015 at 0930. Patient Notified to arrive 15 minutes early, and that it is imperative to arrive on time for appointment to keep from having the test rescheduled. Patient verbalized understanding. Per Allen Kell, CNMT/ Irean Hong, RN

## 2015-01-28 ENCOUNTER — Ambulatory Visit (HOSPITAL_COMMUNITY): Payer: BC Managed Care – PPO | Attending: Cardiovascular Disease

## 2015-01-28 DIAGNOSIS — R0789 Other chest pain: Secondary | ICD-10-CM | POA: Diagnosis not present

## 2015-01-28 LAB — MYOCARDIAL PERFUSION IMAGING
CHL CUP NUCLEAR SRS: 0
CHL CUP NUCLEAR SSS: 1
CHL CUP RESTING HR STRESS: 61 {beats}/min
CSEPHR: 1 %
CSEPPHR: 169 {beats}/min
Estimated workload: 12.5 METS
Exercise duration (min): 10 min
Exercise duration (sec): 30 s
LHR: 0.33
LV dias vol: 95 mL
LV sys vol: 44 mL
MPHR: 164 {beats}/min
RPE: 15
SDS: 1
TID: 0.87

## 2015-01-28 MED ORDER — TECHNETIUM TC 99M SESTAMIBI GENERIC - CARDIOLITE
30.0000 | Freq: Once | INTRAVENOUS | Status: AC | PRN
  Administered 2015-01-28: 30 via INTRAVENOUS

## 2015-01-28 MED ORDER — TECHNETIUM TC 99M SESTAMIBI GENERIC - CARDIOLITE
10.9000 | Freq: Once | INTRAVENOUS | Status: AC | PRN
  Administered 2015-01-28: 10.9 via INTRAVENOUS

## 2015-12-10 IMAGING — NM NM MISC PROCEDURE
8 series · 48 of 48 positions shown · non-contrast
Comparison: none

[Series 1: rest · 6.40mm/px · 6 of 64 frames shown]
[frame 6/64]
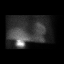
[frame 16/64]
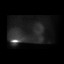
[frame 27/64]
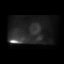
[frame 38/64]
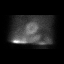
[frame 48/64]
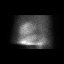
[frame 59/64]
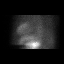

[Series 1: stress-sum-em · 6.40mm/px · 6 of 64 frames shown]
[frame 6/64]
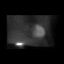
[frame 16/64]
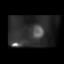
[frame 27/64]
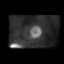
[frame 38/64]
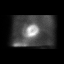
[frame 48/64]
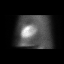
[frame 59/64]
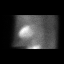

[Series 1: stress-sum-em-mc · 6.40mm/px · 6 of 64 frames shown]
[frame 6/64]
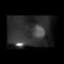
[frame 16/64]
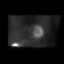
[frame 27/64]
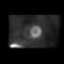
[frame 38/64]
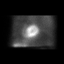
[frame 48/64]
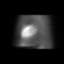
[frame 59/64]
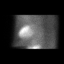

[Series 1: wbr_s-card_st stress-gsp · 6.4mm · 6.40mm/px · 6 of 194 frames shown]
[frame 17/194]
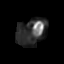
[frame 49/194]
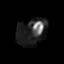
[frame 81/194]
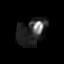
[frame 114/194]
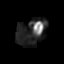
[frame 146/194]
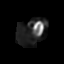
[frame 178/194]
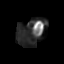

[Series 1: stress-gsp-mc · 6.40mm/px · 6 of 512 frames shown]
[frame 43/512]
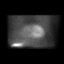
[frame 128/512]
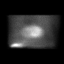
[frame 214/512]
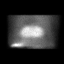
[frame 299/512]
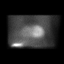
[frame 384/512]
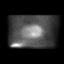
[frame 470/512]
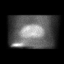

[Series 1: stress-gsp · 6.40mm/px · 6 of 512 frames shown]
[frame 43/512]
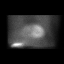
[frame 128/512]
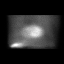
[frame 214/512]
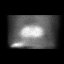
[frame 299/512]
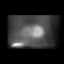
[frame 384/512]
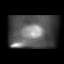
[frame 470/512]
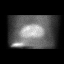

[Series 1: wbr_s-card_st stress-sum-em · 6.4mm · 6.40mm/px · 6 of 24 frames shown]
[frame 3/24]
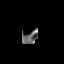
[frame 7/24]
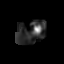
[frame 11/24]
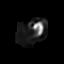
[frame 15/24]
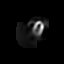
[frame 19/24]
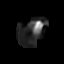
[frame 23/24]
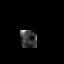

[Series 1: wbr_r-card_st rest · 6.4mm · 6.40mm/px · 6 of 21 frames shown]
[frame 2/21]
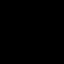
[frame 6/21]
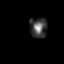
[frame 9/21]
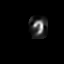
[frame 13/21]
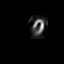
[frame 16/21]
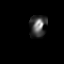
[frame 20/21]
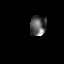

[48 of 48 positions shown; findings below may reference images not displayed]

Canned report from images found in remote index.

Refer to host system for actual result text.

## 2020-04-22 ENCOUNTER — Other Ambulatory Visit (HOSPITAL_COMMUNITY): Payer: Self-pay | Admitting: Family Medicine

## 2020-04-22 DIAGNOSIS — F17219 Nicotine dependence, cigarettes, with unspecified nicotine-induced disorders: Secondary | ICD-10-CM

## 2020-05-18 ENCOUNTER — Encounter (HOSPITAL_COMMUNITY): Payer: Self-pay

## 2020-05-18 ENCOUNTER — Ambulatory Visit (HOSPITAL_COMMUNITY): Payer: BC Managed Care – PPO

## 2021-09-21 ENCOUNTER — Other Ambulatory Visit: Payer: Self-pay

## 2021-09-21 ENCOUNTER — Encounter (HOSPITAL_COMMUNITY): Payer: Self-pay

## 2021-09-21 ENCOUNTER — Emergency Department (HOSPITAL_COMMUNITY)
Admission: EM | Admit: 2021-09-21 | Discharge: 2021-09-21 | Disposition: A | Discharge status: Home / Self Care | Payer: BC Managed Care – PPO | Attending: Student | Admitting: Student

## 2021-09-21 DIAGNOSIS — Z7982 Long term (current) use of aspirin: Secondary | ICD-10-CM | POA: Insufficient documentation

## 2021-09-21 DIAGNOSIS — R112 Nausea with vomiting, unspecified: Secondary | ICD-10-CM | POA: Diagnosis not present

## 2021-09-21 DIAGNOSIS — R531 Weakness: Secondary | ICD-10-CM | POA: Insufficient documentation

## 2021-09-21 DIAGNOSIS — R55 Syncope and collapse: Secondary | ICD-10-CM | POA: Diagnosis not present

## 2021-09-21 DIAGNOSIS — R109 Unspecified abdominal pain: Secondary | ICD-10-CM | POA: Diagnosis not present

## 2021-09-21 LAB — CBC WITH DIFFERENTIAL/PLATELET
Abs Immature Granulocytes: 0.08 10*3/uL — ABNORMAL HIGH (ref 0.00–0.07)
Basophils Absolute: 0.1 10*3/uL (ref 0.0–0.1)
Basophils Relative: 1 %
Eosinophils Absolute: 0.1 10*3/uL (ref 0.0–0.5)
Eosinophils Relative: 1 %
HCT: 41.9 % (ref 39.0–52.0)
Hemoglobin: 14.3 g/dL (ref 13.0–17.0)
Immature Granulocytes: 1 %
Lymphocytes Relative: 8 %
Lymphs Abs: 1.2 10*3/uL (ref 0.7–4.0)
MCH: 31.5 pg (ref 26.0–34.0)
MCHC: 34.1 g/dL (ref 30.0–36.0)
MCV: 92.3 fL (ref 80.0–100.0)
Monocytes Absolute: 1 10*3/uL (ref 0.1–1.0)
Monocytes Relative: 7 %
Neutro Abs: 11.9 10*3/uL — ABNORMAL HIGH (ref 1.7–7.7)
Neutrophils Relative %: 82 %
Platelets: 207 10*3/uL (ref 150–400)
RBC: 4.54 MIL/uL (ref 4.22–5.81)
RDW: 12.9 % (ref 11.5–15.5)
WBC: 14.3 10*3/uL — ABNORMAL HIGH (ref 4.0–10.5)
nRBC: 0 % (ref 0.0–0.2)

## 2021-09-21 LAB — COMPREHENSIVE METABOLIC PANEL
ALT: 20 U/L (ref 0–44)
AST: 22 U/L (ref 15–41)
Albumin: 4.1 g/dL (ref 3.5–5.0)
Alkaline Phosphatase: 62 U/L (ref 38–126)
Anion gap: 13 (ref 5–15)
BUN: 30 mg/dL — ABNORMAL HIGH (ref 8–23)
CO2: 20 mmol/L — ABNORMAL LOW (ref 22–32)
Calcium: 9.1 mg/dL (ref 8.9–10.3)
Chloride: 103 mmol/L (ref 98–111)
Creatinine, Ser: 1.06 mg/dL (ref 0.61–1.24)
GFR, Estimated: >60 mL/min (ref >60)
Glucose, Bld: 160 mg/dL — ABNORMAL HIGH (ref 70–99)
Potassium: 4 mmol/L (ref 3.5–5.1)
Sodium: 136 mmol/L (ref 135–145)
Total Bilirubin: 0.6 mg/dL (ref 0.3–1.2)
Total Protein: 7 g/dL (ref 6.5–8.1)

## 2021-09-21 LAB — D-DIMER, QUANTITATIVE: D-Dimer, Quant: 0.71 ug/mL-FEU — ABNORMAL HIGH (ref 0.00–0.50)

## 2021-09-21 LAB — TROPONIN I (HIGH SENSITIVITY)
Troponin I (High Sensitivity): <2 ng/L (ref <18)
Troponin I (High Sensitivity): 2 ng/L (ref <18)

## 2021-09-21 LAB — LIPASE, BLOOD: Lipase: 37 U/L (ref 11–51)

## 2021-09-21 MED ORDER — ONDANSETRON HCL 4 MG/2ML IJ SOLN
INTRAMUSCULAR | Status: AC
  Administered 2021-09-21: 4 mg via INTRAVENOUS
  Filled 2021-09-21: qty 2

## 2021-09-21 MED ORDER — LACTATED RINGERS IV BOLUS
1000.0000 mL | Freq: Once | INTRAVENOUS | Status: AC
  Administered 2021-09-21: 1000 mL via INTRAVENOUS

## 2021-09-21 MED ORDER — ONDANSETRON HCL 4 MG/2ML IJ SOLN: 4.0000 mg | Freq: Once | INTRAMUSCULAR | Status: AC

## 2021-09-21 NOTE — ED Provider Notes (Signed)
?[facility] ?Provider Note ? ? ?CSN: [id number] ?Arrival date & time: 09/21/21  1815 ? ?  ? ?History ? ?Chief Complaint  ?Patient presents with  ? Weakness  ? ? ?[facility] is a 66 y.o. male.  With past medical history of tobacco use presents to the emergency department with complaint of weakness. ? ?Patient states that yesterday he drove 14 hours back to [place] from [place].  He states this morning he woke up feeling normal.  [place] this morning to return his rental vehicle.  He states that his wife followed him.  They dropped the car off and he got in his wife's car to return home.  He states that while they were returning home he suddenly felt "overheated and began sweating like a pig."  He states that he took off his jacket and went to put it in the backseat when he "blacked out."  He states that he remembers waking up in the car was on the side of the road and his wife had called EMS.  He states that while he was waiting for EMS he felt like his abdomen was cramping and he was having severe nausea.  He had multiple episodes of vomiting after this.  He continues to have abdominal cramping now.  He denies any diarrhea, chest pain, shortness of breath, palpitations, fevers. ? ?The history is provided by the patient.  ?Weakness ?Associated symptoms: abdominal pain, nausea and vomiting   ?Associated symptoms: no chest pain, no diarrhea, no dysuria, no fever and no shortness of breath   ? ?  ? ?Home Medications ?Prior to Admission medications   ?Medication Sig Start Date End Date Taking? Authorizing Provider  ?acetaminophen (TYLENOL) 500 MG tablet Take 500-1,000 mg by mouth every 6 (six) hours as needed for mild pain or headache.    [provider]  ?aspirin EC 81 MG EC tablet Take 1 tablet (81 mg total) by mouth daily. 01/21/15   Dwana MelenaHager, Bryan W, PA-C  ?tetrahydrozoline-zinc (VISINE-AC) 0.05-0.25 % ophthalmic solution Place 2 drops into both eyes 3 (three) times daily  as needed (allergies).    [provider]  ?   ? ?Allergies    ?Patient has no known allergies.   ? ?Review of Systems   ?Review of Systems  ?Constitutional:  Positive for diaphoresis and fatigue. Negative for fever.  ?Respiratory:  Negative for shortness of breath.   ?Cardiovascular:  Negative for chest pain.  ?Gastrointestinal:  Positive for abdominal pain, nausea and vomiting. Negative for abdominal distention and diarrhea.  ?Genitourinary:  Negative for dysuria.  ?Neurological:  Positive for syncope and weakness.  ?All other systems reviewed and are negative. ? ?Physical Exam ?Updated Vital Signs ?BP 134/82   Pulse 82   Temp (!) 97.1 ?F (36.2 ?C) (Axillary)   Resp (!) 21   Ht 6\' 1"  (1.854 m)   Wt 83.9 kg   SpO2 100%   BMI 24.41 kg/m?  ?Physical Exam ?Vitals and nursing note reviewed.  ?Constitutional:   ?   General: He is not in acute distress. ?   Appearance: Normal appearance. He is normal weight. He is ill-appearing. He is not toxic-appearing or diaphoretic.  ?HENT:  ?   Head: Normocephalic and atraumatic.  ?   Mouth/Throat:  ?   Mouth: Mucous membranes are moist.  ?   Pharynx: Oropharynx is clear.  ?Eyes:  ?   General: No scleral icterus. ?   Extraocular Movements: Extraocular movements intact.  ?  Conjunctiva/sclera: Conjunctivae normal.  ?   Pupils: Pupils are equal, round, and reactive to light.  ?Cardiovascular:  ?   Rate and Rhythm: Normal rate and regular rhythm.  ?   Pulses: Normal pulses.  ?   Heart sounds: Normal heart sounds. No murmur heard. ?Pulmonary:  ?   Effort: Pulmonary effort is normal. No respiratory distress.  ?   Breath sounds: Normal breath sounds.  ?Abdominal:  ?   General: Abdomen is flat. Bowel sounds are normal. There is no distension.  ?   Palpations: Abdomen is soft.  ?   Tenderness: There is no abdominal tenderness. There is no guarding or rebound.  ?   Hernia: No hernia is present.  ?Musculoskeletal:     ?   General: Normal range of motion.  ?   Cervical back:  Neck supple.  ?Skin: ?   General: Skin is warm and dry.  ?   Capillary Refill: Capillary refill takes less than 2 seconds.  ?Neurological:  ?   General: No focal deficit present.  ?   Mental Status: He is alert and oriented to person, place, and time. Mental status is at baseline.  ?Psychiatric:     ?   Mood and Affect: Mood normal.     ?   Behavior: Behavior normal.     ?   Thought Content: Thought content normal.     ?   Judgment: Judgment normal.  ? ? ?ED Results / Procedures / Treatments   ?Labs ?(all labs ordered are listed, but only abnormal results are displayed) ?Labs Reviewed  ?COMPREHENSIVE METABOLIC PANEL - Abnormal; Notable for the following components:  ?    Result Value  ? CO2 20 (*)   ? Glucose, Bld 160 (*)   ? BUN 30 (*)   ? All other components within normal limits  ?CBC WITH DIFFERENTIAL/PLATELET - Abnormal; Notable for the following components:  ? WBC 14.3 (*)   ? Neutro Abs 11.9 (*)   ? Abs Immature Granulocytes 0.08 (*)   ? All other components within normal limits  ?D-DIMER, QUANTITATIVE - Abnormal; Notable for the following components:  ? D-Dimer, Quant 0.71 (*)   ? All other components within normal limits  ?LIPASE, BLOOD  ?TROPONIN I (HIGH SENSITIVITY)  ?TROPONIN I (HIGH SENSITIVITY)  ? ?EKG ?EKG Interpretation ? ?Date/Time:  Wednesday September 21 2021 18:40:28 EDT ?Ventricular Rate:  59 ?PR Interval:  129 ?QRS Duration: 105 ?QT Interval:  482 ?QTC Calculation: 478 ?R Axis:   85 ?Text Interpretation: Sinus rhythm Borderline right axis deviation Borderline prolonged QT interval Confirmed by Kommor, [place] [phone]) on 09/21/2021 9:02:42 PM ? ?Radiology ?No results found. ? ?Procedures ?Procedures  ? ?Medications Ordered in ED ?Medications  ?ondansetron (ZOFRAN) injection 4 mg (4 mg Intravenous Given 09/21/21 1828)  ?lactated ringers bolus 1,000 mL (0 mLs Intravenous Stopped 09/21/21 2120)  ? ?ED Course/ Medical Decision Making/ A&P ?  ?                        ?Medical Decision Making ?Amount and/or  Complexity of Data Reviewed ?Labs: ordered. ? ?Risk ?Prescription drug management. ? ?This patient presents to the ED for concern of syncope, this involves an extensive number of treatment options, and is a complaint that carries with it a high risk of complications and morbidity.   ? ?Co morbidities that complicate the patient evaluation ?None ? ?Additional history obtained:  ?Additional history obtained from: Wife at bedside ?  External records from outside source obtained and reviewed including: None available ? ?EKG: ?EKG: normal EKG, normal sinus rhythm.  ? ?Cardiac Monitoring: ?The patient was maintained on a cardiac monitor.  I personally viewed and interpreted the cardiac monitored which showed an underlying rhythm of: Sinus rhythm ? ?Lab Results: ?I personally ordered, reviewed, and interpreted labs. ?Pertinent results include: ?CBC with leukocytosis to 14.3 which I think is likely reactive in his nausea vomiting ?CMP without electrolyte derangements negative bilirubin ?Lipase 37, negative ?Troponin 2, 2, delta 0 ?D-dimer 0.71 ? ?Medications  ?I ordered medication including Zofran for nausea, IV fluids for dehydration ?Reevaluation of the patient after medication shows that patient improved ? ?Tests Considered: ?CTA PE study ? ?ED Course: ?66 year old male who presents to the emergency department for syncope. ? ?Given history, exam and work-up but low suspicion for heart failure.  Does not appear fluid volume overloaded on exam.  Given that he had no headache or trauma to his head doubt intracranial hemorrhage as a reason for his syncope.  No history of seizure and no reported seizure-like activity.  No focal neurologic findings on exam to suggest stroke.  Doubt hokum he has no murmur on exam or family history of sudden death.  EKG without ischemia or infarction, troponin x2 negative, doubt ACS.  Symptoms not consistent with dissection.  EKG without malignant arrhythmia.  Stable hemoglobin ? ?Patient has long  drive yesterday and now with an episode of diaphoresis and syncope I suspicion for concern for PE.  I placed an order for a CTA PE study which the patient refused.  He states that he does not want to pay for t

## 2021-09-21 NOTE — Discharge Instructions (Addendum)
You were seen in the emergency department today for having a syncopal episode (passing out). You work up here has been reassuring. Your labs are normal as well as your EKG. Please continue to drink plenty of fluids as you may be dehydrated from your long drive yesterday.  ?

## 2021-09-21 NOTE — ED Notes (Signed)
Pt refusing CT. EDP informed. ?

## 2021-09-21 NOTE — ED Triage Notes (Signed)
Patient reports generalized weakness with nausea this afternoon while in car. Reports driving 14 hours yesterday. States that he nearly passed out but was able to lay self on ground. EMS administered bolus, all VS normal per report.  ?
